# Patient Record
Sex: Female | Born: 2000 | Race: White | Hispanic: No | Marital: Single | Smoking: Never smoker
Health system: Southern US, Community
[De-identification: ages and names within clinical notes are randomized; demographics above are authoritative.]

## PROBLEM LIST (undated history)

## (undated) DIAGNOSIS — F419 Anxiety disorder, unspecified: Secondary | ICD-10-CM

## (undated) HISTORY — PX: WISDOM TOOTH EXTRACTION: SHX21

---
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
## 2019-04-30 ENCOUNTER — Ambulatory Visit (INDEPENDENT_AMBULATORY_CARE_PROVIDER_SITE_OTHER): Payer: BC Managed Care – PPO | Admitting: Psychology

## 2019-04-30 DIAGNOSIS — F419 Anxiety disorder, unspecified: Secondary | ICD-10-CM

## 2019-05-16 ENCOUNTER — Ambulatory Visit (INDEPENDENT_AMBULATORY_CARE_PROVIDER_SITE_OTHER): Payer: BC Managed Care – PPO | Admitting: Psychology

## 2019-05-16 DIAGNOSIS — F419 Anxiety disorder, unspecified: Secondary | ICD-10-CM | POA: Diagnosis not present

## 2019-05-19 ENCOUNTER — Ambulatory Visit (INDEPENDENT_AMBULATORY_CARE_PROVIDER_SITE_OTHER): Payer: BC Managed Care – PPO | Admitting: Psychology

## 2019-05-19 DIAGNOSIS — F419 Anxiety disorder, unspecified: Secondary | ICD-10-CM | POA: Diagnosis not present

## 2019-05-30 ENCOUNTER — Ambulatory Visit (INDEPENDENT_AMBULATORY_CARE_PROVIDER_SITE_OTHER): Payer: BC Managed Care – PPO | Admitting: Psychology

## 2019-05-30 DIAGNOSIS — F419 Anxiety disorder, unspecified: Secondary | ICD-10-CM | POA: Diagnosis not present

## 2019-06-04 ENCOUNTER — Ambulatory Visit (INDEPENDENT_AMBULATORY_CARE_PROVIDER_SITE_OTHER): Payer: BC Managed Care – PPO | Admitting: Psychology

## 2019-06-04 DIAGNOSIS — F419 Anxiety disorder, unspecified: Secondary | ICD-10-CM | POA: Diagnosis not present

## 2019-06-10 ENCOUNTER — Ambulatory Visit (INDEPENDENT_AMBULATORY_CARE_PROVIDER_SITE_OTHER): Payer: BC Managed Care – PPO | Admitting: Psychology

## 2019-06-10 DIAGNOSIS — F419 Anxiety disorder, unspecified: Secondary | ICD-10-CM | POA: Diagnosis not present

## 2019-06-23 ENCOUNTER — Ambulatory Visit: Payer: BC Managed Care – PPO | Admitting: Psychology

## 2019-06-27 ENCOUNTER — Ambulatory Visit (INDEPENDENT_AMBULATORY_CARE_PROVIDER_SITE_OTHER): Payer: BC Managed Care – PPO | Admitting: Psychology

## 2019-06-27 DIAGNOSIS — F419 Anxiety disorder, unspecified: Secondary | ICD-10-CM

## 2019-07-11 ENCOUNTER — Ambulatory Visit (INDEPENDENT_AMBULATORY_CARE_PROVIDER_SITE_OTHER): Payer: BC Managed Care – PPO | Admitting: Psychology

## 2019-07-11 DIAGNOSIS — F419 Anxiety disorder, unspecified: Secondary | ICD-10-CM | POA: Diagnosis not present

## 2019-07-25 ENCOUNTER — Ambulatory Visit (INDEPENDENT_AMBULATORY_CARE_PROVIDER_SITE_OTHER): Payer: BC Managed Care – PPO | Admitting: Psychology

## 2019-07-25 DIAGNOSIS — F419 Anxiety disorder, unspecified: Secondary | ICD-10-CM

## 2019-08-01 ENCOUNTER — Ambulatory Visit: Payer: BC Managed Care – PPO | Admitting: Psychology

## 2019-08-08 ENCOUNTER — Ambulatory Visit (INDEPENDENT_AMBULATORY_CARE_PROVIDER_SITE_OTHER): Payer: BC Managed Care – PPO | Admitting: Psychology

## 2019-08-08 DIAGNOSIS — F419 Anxiety disorder, unspecified: Secondary | ICD-10-CM | POA: Diagnosis not present

## 2019-08-22 ENCOUNTER — Ambulatory Visit (INDEPENDENT_AMBULATORY_CARE_PROVIDER_SITE_OTHER): Payer: BC Managed Care – PPO | Admitting: Psychology

## 2019-08-22 DIAGNOSIS — F419 Anxiety disorder, unspecified: Secondary | ICD-10-CM

## 2019-09-05 ENCOUNTER — Ambulatory Visit (INDEPENDENT_AMBULATORY_CARE_PROVIDER_SITE_OTHER): Payer: BC Managed Care – PPO | Admitting: Psychology

## 2019-09-05 DIAGNOSIS — F419 Anxiety disorder, unspecified: Secondary | ICD-10-CM | POA: Diagnosis not present

## 2019-09-19 ENCOUNTER — Ambulatory Visit (INDEPENDENT_AMBULATORY_CARE_PROVIDER_SITE_OTHER): Payer: BC Managed Care – PPO | Admitting: Psychology

## 2019-09-19 DIAGNOSIS — F419 Anxiety disorder, unspecified: Secondary | ICD-10-CM | POA: Diagnosis not present

## 2019-10-11 ENCOUNTER — Ambulatory Visit (INDEPENDENT_AMBULATORY_CARE_PROVIDER_SITE_OTHER): Payer: BC Managed Care – PPO | Admitting: Psychology

## 2019-10-11 DIAGNOSIS — F419 Anxiety disorder, unspecified: Secondary | ICD-10-CM | POA: Diagnosis not present

## 2019-10-13 ENCOUNTER — Other Ambulatory Visit: Payer: Self-pay

## 2019-10-17 ENCOUNTER — Ambulatory Visit (INDEPENDENT_AMBULATORY_CARE_PROVIDER_SITE_OTHER): Payer: BC Managed Care – PPO | Admitting: Psychology

## 2019-10-17 DIAGNOSIS — F419 Anxiety disorder, unspecified: Secondary | ICD-10-CM | POA: Diagnosis not present

## 2019-10-22 ENCOUNTER — Ambulatory Visit (INDEPENDENT_AMBULATORY_CARE_PROVIDER_SITE_OTHER): Payer: BC Managed Care – PPO | Admitting: Psychology

## 2019-10-22 DIAGNOSIS — F419 Anxiety disorder, unspecified: Secondary | ICD-10-CM

## 2019-11-03 ENCOUNTER — Ambulatory Visit (INDEPENDENT_AMBULATORY_CARE_PROVIDER_SITE_OTHER): Payer: BC Managed Care – PPO | Admitting: Psychology

## 2019-11-03 DIAGNOSIS — F419 Anxiety disorder, unspecified: Secondary | ICD-10-CM

## 2019-11-14 ENCOUNTER — Ambulatory Visit: Payer: BC Managed Care – PPO | Admitting: Psychology

## 2019-11-17 ENCOUNTER — Ambulatory Visit (INDEPENDENT_AMBULATORY_CARE_PROVIDER_SITE_OTHER): Payer: BC Managed Care – PPO | Admitting: Psychology

## 2019-11-17 DIAGNOSIS — F419 Anxiety disorder, unspecified: Secondary | ICD-10-CM

## 2019-11-28 ENCOUNTER — Ambulatory Visit: Payer: BC Managed Care – PPO | Admitting: Psychology

## 2019-12-01 ENCOUNTER — Ambulatory Visit: Payer: BC Managed Care – PPO | Admitting: Psychology

## 2019-12-06 ENCOUNTER — Ambulatory Visit (INDEPENDENT_AMBULATORY_CARE_PROVIDER_SITE_OTHER): Payer: BC Managed Care – PPO | Admitting: Psychology

## 2019-12-06 DIAGNOSIS — F419 Anxiety disorder, unspecified: Secondary | ICD-10-CM | POA: Diagnosis not present

## 2019-12-12 ENCOUNTER — Ambulatory Visit: Payer: BC Managed Care – PPO | Admitting: Psychology

## 2019-12-15 ENCOUNTER — Ambulatory Visit (INDEPENDENT_AMBULATORY_CARE_PROVIDER_SITE_OTHER): Payer: BC Managed Care – PPO | Admitting: Psychology

## 2019-12-15 DIAGNOSIS — F419 Anxiety disorder, unspecified: Secondary | ICD-10-CM

## 2019-12-26 ENCOUNTER — Ambulatory Visit: Payer: BC Managed Care – PPO | Admitting: Psychology

## 2019-12-29 ENCOUNTER — Ambulatory Visit: Payer: BC Managed Care – PPO | Admitting: Psychology

## 2020-01-02 ENCOUNTER — Ambulatory Visit (INDEPENDENT_AMBULATORY_CARE_PROVIDER_SITE_OTHER): Payer: BC Managed Care – PPO | Admitting: Psychology

## 2020-01-02 DIAGNOSIS — F419 Anxiety disorder, unspecified: Secondary | ICD-10-CM | POA: Diagnosis not present

## 2020-01-09 ENCOUNTER — Ambulatory Visit: Payer: BC Managed Care – PPO | Admitting: Psychology

## 2020-01-12 ENCOUNTER — Ambulatory Visit (INDEPENDENT_AMBULATORY_CARE_PROVIDER_SITE_OTHER): Payer: BC Managed Care – PPO | Admitting: Psychology

## 2020-01-12 DIAGNOSIS — F419 Anxiety disorder, unspecified: Secondary | ICD-10-CM

## 2020-01-23 ENCOUNTER — Ambulatory Visit: Payer: BC Managed Care – PPO | Admitting: Psychology

## 2020-01-26 ENCOUNTER — Ambulatory Visit: Payer: BC Managed Care – PPO | Admitting: Psychology

## 2020-02-06 ENCOUNTER — Ambulatory Visit: Payer: BC Managed Care – PPO | Admitting: Psychology

## 2020-02-09 ENCOUNTER — Ambulatory Visit (INDEPENDENT_AMBULATORY_CARE_PROVIDER_SITE_OTHER): Payer: BC Managed Care – PPO | Admitting: Psychology

## 2020-02-09 DIAGNOSIS — F419 Anxiety disorder, unspecified: Secondary | ICD-10-CM

## 2020-02-20 ENCOUNTER — Ambulatory Visit: Payer: BC Managed Care – PPO | Admitting: Psychology

## 2020-02-23 ENCOUNTER — Ambulatory Visit (INDEPENDENT_AMBULATORY_CARE_PROVIDER_SITE_OTHER): Payer: BC Managed Care – PPO | Admitting: Psychology

## 2020-02-23 DIAGNOSIS — F419 Anxiety disorder, unspecified: Secondary | ICD-10-CM | POA: Diagnosis not present

## 2020-03-05 ENCOUNTER — Ambulatory Visit: Payer: BC Managed Care – PPO | Admitting: Psychology

## 2020-03-08 ENCOUNTER — Ambulatory Visit: Payer: BC Managed Care – PPO | Admitting: Psychology

## 2020-03-19 ENCOUNTER — Ambulatory Visit: Payer: BC Managed Care – PPO | Admitting: Psychology

## 2020-03-22 ENCOUNTER — Ambulatory Visit: Payer: BC Managed Care – PPO | Admitting: Psychology

## 2020-03-29 ENCOUNTER — Ambulatory Visit: Payer: BC Managed Care – PPO | Admitting: Psychology

## 2020-04-02 ENCOUNTER — Ambulatory Visit: Payer: BC Managed Care – PPO | Admitting: Psychology

## 2020-04-16 ENCOUNTER — Ambulatory Visit: Payer: BC Managed Care – PPO | Admitting: Psychology

## 2020-04-30 ENCOUNTER — Ambulatory Visit: Payer: BC Managed Care – PPO | Admitting: Psychology

## 2020-07-09 ENCOUNTER — Ambulatory Visit (INDEPENDENT_AMBULATORY_CARE_PROVIDER_SITE_OTHER): Payer: BC Managed Care – PPO | Admitting: Psychology

## 2020-07-09 DIAGNOSIS — F419 Anxiety disorder, unspecified: Secondary | ICD-10-CM | POA: Diagnosis not present

## 2020-08-05 ENCOUNTER — Ambulatory Visit (INDEPENDENT_AMBULATORY_CARE_PROVIDER_SITE_OTHER): Payer: BC Managed Care – PPO | Admitting: Psychology

## 2020-08-05 DIAGNOSIS — F419 Anxiety disorder, unspecified: Secondary | ICD-10-CM | POA: Diagnosis not present

## 2020-08-25 ENCOUNTER — Ambulatory Visit (INDEPENDENT_AMBULATORY_CARE_PROVIDER_SITE_OTHER): Payer: BC Managed Care – PPO | Admitting: Psychology

## 2020-08-25 DIAGNOSIS — F419 Anxiety disorder, unspecified: Secondary | ICD-10-CM | POA: Diagnosis not present

## 2020-09-16 ENCOUNTER — Ambulatory Visit (INDEPENDENT_AMBULATORY_CARE_PROVIDER_SITE_OTHER): Payer: BC Managed Care – PPO | Admitting: Psychology

## 2020-09-16 DIAGNOSIS — F419 Anxiety disorder, unspecified: Secondary | ICD-10-CM

## 2020-10-06 ENCOUNTER — Ambulatory Visit (INDEPENDENT_AMBULATORY_CARE_PROVIDER_SITE_OTHER): Payer: BC Managed Care – PPO | Admitting: Psychology

## 2020-10-06 DIAGNOSIS — F419 Anxiety disorder, unspecified: Secondary | ICD-10-CM

## 2020-11-17 ENCOUNTER — Ambulatory Visit (INDEPENDENT_AMBULATORY_CARE_PROVIDER_SITE_OTHER): Payer: BC Managed Care – PPO | Admitting: Psychology

## 2020-11-17 DIAGNOSIS — F419 Anxiety disorder, unspecified: Secondary | ICD-10-CM | POA: Diagnosis not present

## 2020-12-08 ENCOUNTER — Ambulatory Visit (INDEPENDENT_AMBULATORY_CARE_PROVIDER_SITE_OTHER): Payer: BC Managed Care – PPO | Admitting: Psychology

## 2020-12-08 DIAGNOSIS — F419 Anxiety disorder, unspecified: Secondary | ICD-10-CM | POA: Diagnosis not present

## 2020-12-29 ENCOUNTER — Ambulatory Visit (INDEPENDENT_AMBULATORY_CARE_PROVIDER_SITE_OTHER): Payer: BC Managed Care – PPO | Admitting: Psychology

## 2020-12-29 DIAGNOSIS — F419 Anxiety disorder, unspecified: Secondary | ICD-10-CM | POA: Diagnosis not present

## 2021-01-19 ENCOUNTER — Ambulatory Visit (INDEPENDENT_AMBULATORY_CARE_PROVIDER_SITE_OTHER): Payer: BC Managed Care – PPO | Admitting: Psychology

## 2021-01-19 ENCOUNTER — Ambulatory Visit: Payer: BC Managed Care – PPO | Admitting: Psychology

## 2021-01-19 DIAGNOSIS — F419 Anxiety disorder, unspecified: Secondary | ICD-10-CM | POA: Diagnosis not present

## 2021-02-09 ENCOUNTER — Ambulatory Visit (INDEPENDENT_AMBULATORY_CARE_PROVIDER_SITE_OTHER): Payer: BC Managed Care – PPO | Admitting: Psychology

## 2021-02-09 DIAGNOSIS — F419 Anxiety disorder, unspecified: Secondary | ICD-10-CM | POA: Diagnosis not present

## 2021-03-10 ENCOUNTER — Ambulatory Visit (INDEPENDENT_AMBULATORY_CARE_PROVIDER_SITE_OTHER): Payer: BC Managed Care – PPO | Admitting: Psychology

## 2021-03-10 DIAGNOSIS — F419 Anxiety disorder, unspecified: Secondary | ICD-10-CM

## 2023-04-06 ENCOUNTER — Ambulatory Visit (INDEPENDENT_AMBULATORY_CARE_PROVIDER_SITE_OTHER): Payer: BC Managed Care – PPO | Admitting: Plastic Surgery

## 2023-04-06 ENCOUNTER — Encounter: Payer: Self-pay | Admitting: Plastic Surgery

## 2023-04-06 VITALS — BP 139/83 | HR 61 | Ht 67.0 in | Wt 176.0 lb

## 2023-04-06 DIAGNOSIS — N62 Hypertrophy of breast: Secondary | ICD-10-CM

## 2023-04-06 DIAGNOSIS — M542 Cervicalgia: Secondary | ICD-10-CM | POA: Diagnosis not present

## 2023-04-06 DIAGNOSIS — Z6827 Body mass index (BMI) 27.0-27.9, adult: Secondary | ICD-10-CM

## 2023-04-06 DIAGNOSIS — M546 Pain in thoracic spine: Secondary | ICD-10-CM

## 2023-04-06 DIAGNOSIS — M549 Dorsalgia, unspecified: Secondary | ICD-10-CM | POA: Insufficient documentation

## 2023-04-06 DIAGNOSIS — R21 Rash and other nonspecific skin eruption: Secondary | ICD-10-CM

## 2023-04-06 DIAGNOSIS — M545 Low back pain, unspecified: Secondary | ICD-10-CM | POA: Diagnosis not present

## 2023-04-06 DIAGNOSIS — G8929 Other chronic pain: Secondary | ICD-10-CM

## 2023-04-06 NOTE — Progress Notes (Signed)
Patient ID: Alice Vasquez, female    DOB: 2000/11/27, 22 y.o.   MRN: [id number]   Chief Complaint  Patient presents with   Advice Only   Breast Problem    Mammary Hyperplasia: The patient is a 22 y.o. female with a history of mammary hyperplasia for several years.  She has extremely large breasts causing symptoms that include the following: Back pain in the upper and lower back, including neck pain. She pulls or pins her bra straps to provide better lift and relief of the pressure and pain. She notices relief by holding her breast up manually.  Her shoulder straps cause grooves and pain and pressure that requires padding for relief. Pain medication is sometimes required with motrin and tylenol.  Activities that are hindered by enlarged breasts include: exercise and running.  She has tried supportive clothing as well as fitted bras without improvement.  Her breasts are extremely large and fairly symmetric.  She has hyperpigmentation of the inframammary area on both sides.  The sternal to nipple distance on the right is 28 cm and the left is 28 cm.  The IMF distance is 12 cm.  She is 5 feet 7 inches tall and weighs 173 pounds.  The BMI = 27.1 kg/m.  Preoperative bra size = D/DD cup. She would like to be as small as possible. The estimated excess breast tissue to be removed at the time of surgery = 400-500 grams on the left and 400-500 grams on the right.  Mammogram history: none.  Family history of breast cancer:  none.  Tobacco use:  none.   The patient expresses the desire to pursue surgical intervention.  The patient has a history of scoliosis and major headaches.  She is also having some right-sided abdominal pain and is going to have an endoscopy for evaluation and evaluation of her gallbladder.  She has scoliosis which is likely contributing to some of the pain and discomfort.  She has not had physical therapy yet.    Review of Systems  Constitutional:  Positive for activity change.  Negative for appetite change.  Eyes: Negative.   Respiratory: Negative.  Negative for chest tightness and shortness of breath.   Cardiovascular: Negative.   Gastrointestinal: Negative.   Endocrine: Negative.   Genitourinary: Negative.   Musculoskeletal:  Positive for back pain and neck pain.  Skin:  Positive for rash.    History reviewed. No pertinent past medical history.  History reviewed. No pertinent surgical history.    Current Outpatient Medications:    ibuprofen (ADVIL,MOTRIN) 400 MG tablet, Take 1 tablet (400 mg total) by mouth every 6 (six) hours as needed for pain. Take 1 tablet three times daily with meal, Disp: 30 tablet, Rfl: 0   Objective:   There were no vitals filed for this visit.  Physical Exam Vitals and nursing note reviewed.  Constitutional:      Appearance: Normal appearance.  HENT:     Head: Normocephalic and atraumatic.  Cardiovascular:     Rate and Rhythm: Normal rate.     Pulses: Normal pulses.  Pulmonary:     Effort: Pulmonary effort is normal.  Abdominal:     Palpations: Abdomen is soft.     Tenderness: There is no abdominal tenderness.  Musculoskeletal:        General: No swelling or deformity.  Skin:    General: Skin is warm.     Capillary Refill: Capillary refill takes less than 2 seconds.  Coloration: Skin is not jaundiced.     Findings: No bruising.  Neurological:     Mental Status: She is alert and oriented to person, place, and time.  Psychiatric:        Mood and Affect: Mood normal.        Behavior: Behavior normal.        Thought Content: Thought content normal.        Judgment: Judgment normal.    Assessment & Plan:  Chronic bilateral thoracic back pain  Neck pain  Symptomatic mammary hypertrophy  The procedure the patient selected and that was best for the patient was discussed. The risk were discussed and include but not limited to the following:  Breast asymmetry, fluid accumulation, firmness of the breast,  inability to breast feed, loss of nipple or areola, skin loss, change in skin and nipple sensation, fat necrosis of the breast tissue, bleeding, infection and healing delay.  There are risks of anesthesia and injury to nerves or blood vessels.  Allergic reaction to tape, suture and skin glue are possible.  There will be swelling.  Any of these can lead to the need for revisional surgery which is not included in this surgery.  A breast reduction has potential to interfere with diagnostic procedures in the future.  This procedure is best done when the breast is fully developed.  Changes in the breast will continue to occur over time: pregnancy, weight gain or weigh loss. No guarantees are given for a certain bra or breast size.    Total time: 40 minutes. This includes time spent with the patient during the visit as well as time spent before and after the visit reviewing the chart, documenting the encounter, ordering pertinent studies and literature for the patient.   Physical therapy: Ordered Mammogram: Not indicated  Patient has [employer] and will plan for physical therapy for 6 weeks and then see [place] back.  In the meantime she will try to lose 5 to 10 pounds and we will reevaluate her numbers.  I understand that she will likely need around 550 to 600 g removed from each breast at her current numbers.  Pictures were obtained of the patient and placed in the chart with the patient's or guardian's permission.   Alena Bills Kairie Vangieson, DO

## 2023-04-06 NOTE — Addendum Note (Signed)
Addended by: Verdie Shire on: 04/06/2023 03:57 PM   Modules accepted: Orders

## 2023-04-20 ENCOUNTER — Telehealth: Payer: Self-pay

## 2023-04-20 NOTE — Telephone Encounter (Signed)
Patient called wanting to know if her Physical Therapy referral can be for [facility] instead of [facility].  Advised patient it may not be in-network and we may have to do another Pre-authorization to be seen in [facility]  Patient verbalized she lives in [place], [place] and does not want to drive here for Physical Therapy.  Please follow up with patient @ [phone].

## 2023-05-09 ENCOUNTER — Ambulatory Visit: Payer: BC Managed Care – PPO

## 2023-05-28 ENCOUNTER — Ambulatory Visit: Payer: Self-pay | Admitting: Student

## 2023-06-06 ENCOUNTER — Telehealth (INDEPENDENT_AMBULATORY_CARE_PROVIDER_SITE_OTHER): Payer: BC Managed Care – PPO | Admitting: Plastic Surgery

## 2023-06-06 ENCOUNTER — Ambulatory Visit (INDEPENDENT_AMBULATORY_CARE_PROVIDER_SITE_OTHER): Payer: BC Managed Care – PPO | Admitting: Student

## 2023-06-06 VITALS — Wt 172.0 lb

## 2023-06-06 DIAGNOSIS — M542 Cervicalgia: Secondary | ICD-10-CM | POA: Diagnosis not present

## 2023-06-06 DIAGNOSIS — R21 Rash and other nonspecific skin eruption: Secondary | ICD-10-CM | POA: Diagnosis not present

## 2023-06-06 DIAGNOSIS — M25511 Pain in right shoulder: Secondary | ICD-10-CM

## 2023-06-06 DIAGNOSIS — N62 Hypertrophy of breast: Secondary | ICD-10-CM

## 2023-06-06 DIAGNOSIS — M25512 Pain in left shoulder: Secondary | ICD-10-CM

## 2023-06-06 NOTE — Telephone Encounter (Signed)
Done with PT

## 2023-06-06 NOTE — Progress Notes (Signed)
   Referring Provider No referring provider defined for this encounter.   CC:  Chief Complaint  Patient presents with   Follow-up      Alice Vasquez is an 22 y.o. female.  HPI: Patient is a 22 y.o. year old female here for follow up after completing physical therapy for pain related to macromastia.   She was seen for initial consult by Dr. Ulice Bold on 04/06/2023.  At that time, patient complained of back and neck pain.  She also reported that activities were hindered by her enlarged breasts including exercise and running.  On exam, her STN on the right was 28 cm and her STN on the left was at 28 cm.  Her BMI was 27.1 kg/m.  Her preoperative bra size is a D/DDD cup.  Patient expressed that she would like to be as small as possible.  The estimated amount of tissue to be removed at the time of surgery was 400 500 g bilaterally.  Patient did reports she had history of scoliosis and major headaches.  She also reported at the time she was having some right-sided abdominal pain and was going to have an endoscopy for evaluation.  Plan was for patient to do 6 weeks of physical therapy and also try to lose 5 to 10 pounds.  Today, patient reports she is doing well.  She reports that since her previous visit, she underwent the endoscopy which showed inflammation along her stomach.  She also states that she was found to have an overactive gallbladder.  She states though that she was placed on medications for management, and will not be undergoing any procedures at this time.    Patient also states that she has completed 6 weeks of physical therapy.  She states that although she has been undergoing physical therapy, she is still experiencing neck and shoulder pain.  She states that her shoulder pain is continued and bothersome.  Patient also reports she gets intermittent rashes underneath her breast.  She expresses the desire to undergo breast reduction.   Review of Systems General: Denies any changes in  her health.  Denies any fevers or chills MSK: Endorses ongoing back and neck discomfort Skin: Reports intermittent rashes  Physical Exam    06/06/2023    1:26 PM 04/06/2023   11:00 AM 03/03/2013    2:56 PM  Vitals with BMI  Height  5\' 7"    Weight 172 lbs 176 lbs 110 lbs  BMI  27.56   Systolic  139 116  Diastolic  83 73  Pulse  61 76    General:  No acute distress,  Alert and oriented, Non-Toxic, Normal speech and affect Psych: Normal behavior and mood Respiratory: No increased WOB MSK: Ambulatory  Assessment/Plan  Patient is interested in pursuing surgical intervention for bilateral breast reduction. Patient has completed at least 6 weeks of physical therapy for pain related to macromastia.  Discussed with patient we would submit to insurance for authorization, discussed approval could take up to 6 weeks.   Laurena Spies 06/06/2023, 1:47 PM

## 2023-06-11 NOTE — Addendum Note (Signed)
Addended by: Caroline More on: 06/11/2023 12:23 PM   Modules accepted: Orders

## 2023-12-05 ENCOUNTER — Telehealth: Payer: Self-pay | Admitting: Plastic Surgery

## 2023-12-14 ENCOUNTER — Ambulatory Visit (INDEPENDENT_AMBULATORY_CARE_PROVIDER_SITE_OTHER): Payer: BC Managed Care – PPO | Admitting: Surgical

## 2023-12-14 ENCOUNTER — Encounter: Payer: Self-pay | Admitting: Surgical

## 2023-12-14 VITALS — BP 143/89 | HR 74 | Ht 67.0 in | Wt 170.0 lb

## 2023-12-14 DIAGNOSIS — N62 Hypertrophy of breast: Secondary | ICD-10-CM

## 2023-12-14 DIAGNOSIS — G8929 Other chronic pain: Secondary | ICD-10-CM

## 2023-12-14 DIAGNOSIS — M542 Cervicalgia: Secondary | ICD-10-CM

## 2023-12-14 DIAGNOSIS — M546 Pain in thoracic spine: Secondary | ICD-10-CM

## 2023-12-14 MED ORDER — ONDANSETRON HCL 4 MG PO TABS: 4.0000 mg | ORAL_TABLET | Freq: Three times a day (TID) | ORAL | 0 refills | Status: AC | PRN

## 2023-12-14 MED ORDER — CEPHALEXIN 500 MG PO CAPS: 500.0000 mg | ORAL_CAPSULE | Freq: Four times a day (QID) | ORAL | 0 refills | Status: AC

## 2023-12-14 MED ORDER — OXYCODONE HCL 5 MG PO TABS: 5.0000 mg | ORAL_TABLET | Freq: Four times a day (QID) | ORAL | 0 refills | Status: AC | PRN

## 2023-12-14 NOTE — Progress Notes (Signed)
 Patient ID: Alice Vasquez, female    DOB: 21-Nov-2000, 23 y.o.   MRN: [id number]  Chief Complaint  Patient presents with   Pre-op Exam      ICD-10-CM   1. Symptomatic mammary hypertrophy  N62     2. Chronic bilateral thoracic back pain  M54.6    G89.29     3. Neck pain  M54.2       History of Present Illness: Alice Vasquez is a 23 y.o.  female  with a history of macromastia.  She presents for preoperative evaluation for upcoming procedure, Bilateral Breast Reduction with possible liposuction, scheduled for 12/24/2023 with Dr.  Ulice Bold  The patient has not had problems with anesthesia. No history of DVT/PE.  No family history of DVT/PE.  No family or personal history of bleeding or clotting disorders.  Patient is not currently taking any blood thinners.  No history of CVA/MI.   Summary of Previous Visit: STN is 28 cm bilaterally, IMF is 12 cm.  Preoperative bra size equals D/double D cup.  She would like to be as small as possible.  No family history of breast cancer.  No tobacco use.  She has a history of scoliosis.  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  She has had an endoscopy and colonoscopy in the past.  No history of Crohn's or ulcerative colitis.  She has tolerated colonoscopy and endoscopy in the past without any issues, she has also had dental surgery without issues.  No known family history of anesthetic complications.  She has not had any recent changes to her health.  She reports that she would like to be about a B/small C cup.  She does report a history of allergies, has been seen by an allergist.  She was prescribed albuterol, but reports that she never uses this and does not have any history of asthma.  Past Medical History: Allergies: No Known Allergies  Current Medications:  Current Outpatient Medications:    albuterol (VENTOLIN HFA) 108 (90  Base) MCG/ACT inhaler, SMARTSIG:2 Puff(s) By Mouth Every 4 Hours PRN, Disp: , Rfl:    ALLERGY RELIEF 180 MG tablet, Take 180 mg by mouth as needed for allergies., Disp: , Rfl:    AUVI-Q 0.3 MG/0.3ML SOAJ injection, Inject into the muscle as needed (allergies)., Disp: , Rfl:    busPIRone (BUSPAR) 5 MG tablet, Take 5 mg by mouth 2 (two) times daily., Disp: , Rfl:    dicyclomine (BENTYL) 10 MG capsule, Take 10 mg by mouth 3 (three) times daily as needed., Disp: , Rfl:    fluticasone (FLONASE) 50 MCG/ACT nasal spray, as needed., Disp: , Rfl:    hydrOXYzine (ATARAX) 10 MG tablet, Take 10 mg by mouth at bedtime., Disp: , Rfl:    ibuprofen (ADVIL,MOTRIN) 400 MG tablet, Take 1 tablet (400 mg total) by mouth every 6 (six) hours as needed for pain. Take 1 tablet three times daily with meal, Disp: 30 tablet, Rfl: 0   terbinafine (LAMISIL) 1 % cream, Apply to the affected area twice a day for 1-2 weeks., Disp: , Rfl:   Past Medical Problems: History reviewed. No pertinent past medical history.  Past Surgical History: History reviewed. No pertinent surgical history.  Social History: Social History   Socioeconomic History   Marital status: Single    Spouse name: Not on file   Number  of children: Not on file   Years of education: Not on file   Highest education level: Not on file  Occupational History   Not on file  Tobacco Use   Smoking status: Never   Smokeless tobacco: Not on file  Substance and Sexual Activity   Alcohol use: No   Drug use: No   Sexual activity: Not on file  Other Topics Concern   Not on file  Social History Narrative   Not on file   Social Drivers of Health   Financial Resource Strain: Not on file  Food Insecurity: Not on file  Transportation Needs: Not on file  Physical Activity: Not on file  Stress: Not on file  Social Connections: Not on file  Intimate Partner Violence: Not on file    Family History: History reviewed. No pertinent family history.  Review  of Systems: Review of Systems  Constitutional: Negative.   Respiratory: Negative.    Cardiovascular: Negative.   Genitourinary: Negative.     Physical Exam: Vital Signs BP (!) 143/89 (BP Location: Left Arm, Patient Position: Sitting, Cuff Size: Normal)   Pulse 74   Ht 5\' 7"  (1.702 m)   Wt 170 lb (77.1 kg)   SpO2 97%   BMI 26.63 kg/m   Physical Exam Constitutional:      General: Not in acute distress.    Appearance: Normal appearance. Not ill-appearing.  HENT:     Head: Normocephalic and atraumatic.  Eyes:     Pupils: Pupils are equal, round Neck:     Musculoskeletal: Normal range of motion.  Cardiovascular:     Rate and Rhythm: Normal rate    Pulses: Normal pulses.  Pulmonary:     Effort: Pulmonary effort is normal. No respiratory distress.  Musculoskeletal: Normal range of motion.  Skin:    General: Skin is warm and dry.     Findings: No erythema or rash.  Neurological:     General: No focal deficit present.     Mental Status: Alert and oriented to person, place, and time. Mental status is at baseline.     Motor: No weakness.  Psychiatric:        Mood and Affect: Mood normal.        Behavior: Behavior normal.    Assessment/Plan: The patient is scheduled for bilateral breast reduction with possible liposuction with Dr. Ulice Bold.  Risks, benefits, and alternatives of procedure discussed, questions answered and consent obtained.    Smoking Status: Non-smoker; Counseling Given?  N/A Last Mammogram: No mammographic history due to age; Results: N/A  Caprini Score: 3; Risk Factors include: BMI > 25, and length of planned surgery. Recommendation for mechanical  prophylaxis. Encourage early ambulation.   Pictures obtained: @consult   Post-op Rx sent to pharmacy: Oxycodone, Zofran, Keflex  Patient was provided with the breast reduction and General Surgical Risk consent document and Pain Medication Agreement prior to their appointment.  They had adequate time to read  through the risk consent documents and Pain Medication Agreement. We also discussed them in person together during this preop appointment. All of their questions were answered to their satisfaction.  Recommended calling if they have any further questions.  Risk consent form and Pain Medication Agreement to be scanned into patient's chart.  The risk that can be encountered with breast reduction were discussed and include the following but not limited to these:  Breast asymmetry, fluid accumulation, firmness of the breast, inability to breast feed, loss of nipple or areola, skin loss,  decrease or no nipple sensation, fat necrosis of the breast tissue, bleeding, infection, healing delay.  There are risks of anesthesia, changes to skin sensation and injury to nerves or blood vessels.  The muscle can be temporarily or permanently injured.  You may have an allergic reaction to tape, suture, glue, blood products which can result in skin discoloration, swelling, pain, skin lesions, poor healing.  Any of these can lead to the need for revisonal surgery or stage procedures.  A reduction has potential to interfere with diagnostic procedures.  Nipple or breast piercing can increase risks of infection.  This procedure is best done when the breast is fully developed.  Changes in the breast will continue to occur over time.  Pregnancy can alter the outcomes of previous breast reduction surgery, weight gain and weigh loss can also effect the long term appearance.   Confirmed with surgical schedulers today that 575 g was required to be removed from bilateral breasts.  Electronically signed by: Kermit Balo Lamount Bankson, PA-C 12/14/2023 10:08 AM

## 2023-12-14 NOTE — H&P (View-Only) (Signed)
 Patient ID: Alice Vasquez, female    DOB: 08-24-2001, 23 y.o.   MRN: [id number]  Chief Complaint  Patient presents with   Pre-op Exam      ICD-10-CM   1. Symptomatic mammary hypertrophy  N62     2. Chronic bilateral thoracic back pain  M54.6    G89.29     3. Neck pain  M54.2       History of Present Illness: Alice Vasquez is a 23 y.o.  female  with a history of macromastia.  She presents for preoperative evaluation for upcoming procedure, Bilateral Breast Reduction with possible liposuction, scheduled for 12/24/2023 with Dr.  Lowery  The patient has not had problems with anesthesia. No history of DVT/PE.  No family history of DVT/PE.  No family or personal history of bleeding or clotting disorders.  Patient is not currently taking any blood thinners.  No history of CVA/MI.   Summary of Previous Visit: STN is 28 cm bilaterally, IMF is 12 cm.  Preoperative bra size equals D/double D cup.  She would like to be as small as possible.  No family history of breast cancer.  No tobacco use.  She has a history of scoliosis.  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  She has had an endoscopy and colonoscopy in the past.  No history of Crohn's or ulcerative colitis.  She has tolerated colonoscopy and endoscopy in the past without any issues, she has also had dental surgery without issues.  No known family history of anesthetic complications.  She has not had any recent changes to her health.  She reports that she would like to be about a B/small C cup.  She does report a history of allergies, has been seen by an allergist.  She was prescribed albuterol, but reports that she never uses this and does not have any history of asthma.  Past Medical History: Allergies: No Known Allergies  Current Medications:  Current Outpatient Medications:    albuterol (VENTOLIN HFA) 108 (90  Base) MCG/ACT inhaler, SMARTSIG:2 Puff(s) By Mouth Every 4 Hours PRN, Disp: , Rfl:    ALLERGY RELIEF 180 MG tablet, Take 180 mg by mouth as needed for allergies., Disp: , Rfl:    AUVI-Q  0.3 MG/0.3ML SOAJ injection, Inject into the muscle as needed (allergies)., Disp: , Rfl:    busPIRone (BUSPAR) 5 MG tablet, Take 5 mg by mouth 2 (two) times daily., Disp: , Rfl:    dicyclomine (BENTYL) 10 MG capsule, Take 10 mg by mouth 3 (three) times daily as needed., Disp: , Rfl:    fluticasone (FLONASE) 50 MCG/ACT nasal spray, as needed., Disp: , Rfl:    hydrOXYzine (ATARAX) 10 MG tablet, Take 10 mg by mouth at bedtime., Disp: , Rfl:    ibuprofen  (ADVIL ,MOTRIN ) 400 MG tablet, Take 1 tablet (400 mg total) by mouth every 6 (six) hours as needed for pain. Take 1 tablet three times daily with meal, Disp: 30 tablet, Rfl: 0   terbinafine (LAMISIL) 1 % cream, Apply to the affected area twice a day for 1-2 weeks., Disp: , Rfl:   Past Medical Problems: History reviewed. No pertinent past medical history.  Past Surgical History: History reviewed. No pertinent surgical history.  Social History: Social History   Socioeconomic History   Marital status: Single    Spouse name: Not on file   Number  of children: Not on file   Years of education: Not on file   Highest education level: Not on file  Occupational History   Not on file  Tobacco Use   Smoking status: Never   Smokeless tobacco: Not on file  Substance and Sexual Activity   Alcohol use: No   Drug use: No   Sexual activity: Not on file  Other Topics Concern   Not on file  Social History Narrative   Not on file   Social Drivers of Health   Financial Resource Strain: Not on file  Food Insecurity: Not on file  Transportation Needs: Not on file  Physical Activity: Not on file  Stress: Not on file  Social Connections: Not on file  Intimate Partner Violence: Not on file    Family History: History reviewed. No pertinent family history.  Review  of Systems: Review of Systems  Constitutional: Negative.   Respiratory: Negative.    Cardiovascular: Negative.   Genitourinary: Negative.     Physical Exam: Vital Signs BP (!) 143/89 (BP Location: Left Arm, Patient Position: Sitting, Cuff Size: Normal)   Pulse 74   Ht 5' 7 (1.702 m)   Wt 170 lb (77.1 kg)   SpO2 97%   BMI 26.63 kg/m   Physical Exam Constitutional:      General: Not in acute distress.    Appearance: Normal appearance. Not ill-appearing.  HENT:     Head: Normocephalic and atraumatic.  Eyes:     Pupils: Pupils are equal, round Neck:     Musculoskeletal: Normal range of motion.  Cardiovascular:     Rate and Rhythm: Normal rate    Pulses: Normal pulses.  Pulmonary:     Effort: Pulmonary effort is normal. No respiratory distress.  Musculoskeletal: Normal range of motion.  Skin:    General: Skin is warm and dry.     Findings: No erythema or rash.  Neurological:     General: No focal deficit present.     Mental Status: Alert and oriented to person, place, and time. Mental status is at baseline.     Motor: No weakness.  Psychiatric:        Mood and Affect: Mood normal.        Behavior: Behavior normal.    Assessment/Plan: The patient is scheduled for bilateral breast reduction with possible liposuction with Dr. Lowery.  Risks, benefits, and alternatives of procedure discussed, questions answered and consent obtained.    Smoking Status: Non-smoker; Counseling Given?  N/A Last Mammogram: No mammographic history due to age; Results: N/A  Caprini Score: 3; Risk Factors include: BMI > 25, and length of planned surgery. Recommendation for mechanical  prophylaxis. Encourage early ambulation.   Pictures obtained: @consult   Post-op Rx sent to pharmacy: Oxycodone , Zofran , Keflex   Patient was provided with the breast reduction and General Surgical Risk consent document and Pain Medication Agreement prior to their appointment.  They had adequate time to read  through the risk consent documents and Pain Medication Agreement. We also discussed them in person together during this preop appointment. All of their questions were answered to their satisfaction.  Recommended calling if they have any further questions.  Risk consent form and Pain Medication Agreement to be scanned into patient's chart.  The risk that can be encountered with breast reduction were discussed and include the following but not limited to these:  Breast asymmetry, fluid accumulation, firmness of the breast, inability to breast feed, loss of nipple or areola, skin loss,  decrease or no nipple sensation, fat necrosis of the breast tissue, bleeding, infection, healing delay.  There are risks of anesthesia, changes to skin sensation and injury to nerves or blood vessels.  The muscle can be temporarily or permanently injured.  You may have an allergic reaction to tape, suture, glue, blood products which can result in skin discoloration, swelling, pain, skin lesions, poor healing.  Any of these can lead to the need for revisonal surgery or stage procedures.  A reduction has potential to interfere with diagnostic procedures.  Nipple or breast piercing can increase risks of infection.  This procedure is best done when the breast is fully developed.  Changes in the breast will continue to occur over time.  Pregnancy can alter the outcomes of previous breast reduction surgery, weight gain and weigh loss can also effect the long term appearance.   Confirmed with surgical schedulers today that 575 g was required to be removed from bilateral breasts.  Electronically signed by: Donnice PARAS Tobiah Celestine, PA-C 12/14/2023 10:08 AM

## 2023-12-18 NOTE — Pre-Procedure Instructions (Signed)
Surgical Instructions   Your procedure is scheduled on December 24, 2023. Report to [facility] Main Entrance "A" at 7:30 A.M., then check in with the Admitting office. Any questions or running late day of surgery: call [phone]  Questions prior to your surgery date: call [phone], Monday-Friday, 8am-4pm. If you experience any cold or flu symptoms such as cough, fever, chills, shortness of breath, etc. between now and your scheduled surgery, please notify us at the above number.     Remember:  Do not eat after midnight the night before your surgery   You may drink clear liquids until 6:30 AM the morning of your surgery.   Clear liquids allowed are: Water, Non-Citrus Juices (without pulp), Carbonated Beverages, Clear Tea (no milk, honey, etc.), Black Coffee Only (NO MILK, CREAM OR POWDERED CREAMER of any kind), and Gatorade.    Take these medicines the morning of surgery with A SIP OF WATER: NONE   May take these medicines IF NEEDED: hydrOXYzine (ATARAX)  ondansetron (ZOFRAN)  oxyCODONE (OXY IR/ROXICODONE)  hyoscyamine (LEVSIN SL)    One week prior to surgery, STOP taking any Aspirin (unless otherwise instructed by your surgeon) Aleve, Naproxen, Ibuprofen, Motrin, Advil, Goody's, BC's, all herbal medications, fish oil, and non-prescription vitamins.                     Do NOT Smoke (Tobacco/Vaping) for 24 hours prior to your procedure.  If you use a CPAP at night, you may bring your mask/headgear for your overnight stay.   You will be asked to remove any contacts, glasses, piercing's, hearing aid's, dentures/partials prior to surgery. Please bring cases for these items if needed.    Patients discharged the day of surgery will not be allowed to drive home, and someone needs to stay with them for 24 hours.  SURGICAL WAITING ROOM VISITATION Patients may have no more than 2 support people in the waiting area - these visitors may rotate.   Pre-op nurse will coordinate an  appropriate time for 1 ADULT support person, who may not rotate, to accompany patient in pre-op.  Children under the age of 34 must have an adult with them who is not the patient and must remain in the main waiting area with an adult.  If the patient needs to stay at the hospital during part of their recovery, the visitor guidelines for inpatient rooms apply.  Please refer to the [facility] website for the visitor guidelines for any additional information.   If you received a COVID test during your pre-op visit  it is requested that you wear a mask when out in public, stay away from anyone that may not be feeling well and notify your surgeon if you develop symptoms. If you have been in contact with anyone that has tested positive in the last 10 days please notify you surgeon.      Pre-operative CHG Bathing Instructions   You can play a key role in reducing the risk of infection after surgery. Your skin needs to be as free of germs as possible. You can reduce the number of germs on your skin by washing with CHG (chlorhexidine gluconate) soap before surgery. CHG is an antiseptic soap that kills germs and continues to kill germs even after washing.   DO NOT use if you have an allergy to chlorhexidine/CHG or antibacterial soaps. If your skin becomes reddened or irritated, stop using the CHG and notify one of our RNs at [phone].  TAKE A SHOWER THE NIGHT BEFORE SURGERY AND THE DAY OF SURGERY    Please keep in mind the following:  DO NOT shave, including legs and underarms, 48 hours prior to surgery.   You may shave your face before/day of surgery.  Place clean sheets on your bed the night before surgery Use a clean washcloth (not used since being washed) for each shower. DO NOT sleep with pet's night before surgery.  CHG Shower Instructions:  Wash your face and private area with normal soap. If you choose to wash your hair, wash first with your normal shampoo.  After you use  shampoo/soap, rinse your hair and body thoroughly to remove shampoo/soap residue.  Turn the water OFF and apply half the bottle of CHG soap to a CLEAN washcloth.  Apply CHG soap ONLY FROM YOUR NECK DOWN TO YOUR TOES (washing for 3-5 minutes)  DO NOT use CHG soap on face, private areas, open wounds, or sores.  Pay special attention to the area where your surgery is being performed.  If you are having back surgery, having someone wash your back for you may be helpful. Wait 2 minutes after CHG soap is applied, then you may rinse off the CHG soap.  Pat dry with a clean towel  Put on clean pajamas    Additional instructions for the day of surgery: DO NOT APPLY any lotions, deodorants, cologne, or perfumes.   Do not wear jewelry or makeup Do not wear nail polish, gel polish, artificial nails, or any other type of covering on natural nails (fingers and toes) Do not bring valuables to the hospital. [facility] is not responsible for valuables/personal belongings. Put on clean/comfortable clothes.  Please brush your teeth.  Ask your nurse before applying any prescription medications to the skin.

## 2023-12-19 ENCOUNTER — Other Ambulatory Visit (HOSPITAL_COMMUNITY): Payer: BC Managed Care – PPO

## 2023-12-21 ENCOUNTER — Other Ambulatory Visit: Payer: Self-pay

## 2023-12-21 ENCOUNTER — Encounter (HOSPITAL_COMMUNITY): Payer: Self-pay

## 2023-12-21 ENCOUNTER — Encounter (HOSPITAL_COMMUNITY)
Admission: RE | Admit: 2023-12-21 | Discharge: 2023-12-21 | Disposition: A | Discharge status: Home / Self Care | Payer: BC Managed Care – PPO | Source: Ambulatory Visit | Attending: Plastic Surgery | Admitting: Plastic Surgery

## 2023-12-21 VITALS — BP 140/95 | HR 96 | Temp 98.4°F | Resp 16 | Ht 67.0 in | Wt 175.0 lb

## 2023-12-21 DIAGNOSIS — Z01812 Encounter for preprocedural laboratory examination: Secondary | ICD-10-CM | POA: Insufficient documentation

## 2023-12-21 DIAGNOSIS — M546 Pain in thoracic spine: Secondary | ICD-10-CM | POA: Diagnosis not present

## 2023-12-21 DIAGNOSIS — M542 Cervicalgia: Secondary | ICD-10-CM | POA: Diagnosis not present

## 2023-12-21 DIAGNOSIS — Z01818 Encounter for other preprocedural examination: Secondary | ICD-10-CM

## 2023-12-21 DIAGNOSIS — N62 Hypertrophy of breast: Secondary | ICD-10-CM | POA: Diagnosis present

## 2023-12-21 DIAGNOSIS — M419 Scoliosis, unspecified: Secondary | ICD-10-CM | POA: Diagnosis not present

## 2023-12-21 HISTORY — DX: Anxiety disorder, unspecified: F41.9

## 2023-12-21 LAB — CBC
HCT: 40.7 % (ref 36.0–46.0)
Hemoglobin: 13.8 g/dL (ref 12.0–15.0)
MCH: 28.9 pg (ref 26.0–34.0)
MCHC: 33.9 g/dL (ref 30.0–36.0)
MCV: 85.1 fL (ref 80.0–100.0)
Platelets: 233 10*3/uL (ref 150–400)
RBC: 4.78 MIL/uL (ref 3.87–5.11)
RDW: 11.8 % (ref 11.5–15.5)
WBC: 6.8 10*3/uL (ref 4.0–10.5)
nRBC: 0 % (ref 0.0–0.2)

## 2023-12-21 NOTE — Progress Notes (Signed)
PCP - Tiffany Bryan,DO Cardiologist - denies  PPM/ICD - denies Device Orders -  Rep Notified -   Chest x-ray - na EKG - na Stress Test -denies  ECHO - denies Cardiac Cath - denies  Sleep Study - denies CPAP -   Fasting Blood Sugar - na Checks Blood Sugar _____ times a day  Last dose of GLP1 agonist-  na GLP1 instructions:   Blood Thinner Instructions:na Aspirin Instructions:na  ERAS Protcol -no PRE-SURGERY Ensure or G2-   COVID TEST- na   Anesthesia review: no  Patient denies shortness of breath, fever, cough and chest pain at PAT appointment   All instructions explained to the patient, with a verbal understanding of the material. Patient agrees to go over the instructions while at home for a better understanding. Patient also instructed to wear a mask when out in public prior to surgery The opportunity to ask questions was provided.

## 2023-12-21 NOTE — Pre-Procedure Instructions (Signed)
Surgical Instructions   Your procedure is scheduled on December 24, 2023. Report to [facility] Main Entrance "A" at 7:30 A.M., then check in with the Admitting office. Any questions or running late day of surgery: call [phone]  Questions prior to your surgery date: call [phone], Monday-Friday, 8am-4pm. If you experience any cold or flu symptoms such as cough, fever, chills, shortness of breath, etc. between now and your scheduled surgery, please notify us at the above number.     Remember:  Do not eat or drink anything after midnight the night before your surgery   Take these medicines the morning of surgery with A SIP OF WATER: NONE   May take these medicines IF NEEDED: hydrOXYzine (ATARAX)  ondansetron (ZOFRAN)  oxyCODONE (OXY IR/ROXICODONE)  hyoscyamine (LEVSIN SL)    One week prior to surgery, STOP taking any Aspirin (unless otherwise instructed by your surgeon) Aleve, Naproxen, Ibuprofen, Motrin, Advil, Goody's, BC's, all herbal medications, fish oil, and non-prescription vitamins.                     Do NOT Smoke (Tobacco/Vaping) for 24 hours prior to your procedure.  If you use a CPAP at night, you may bring your mask/headgear for your overnight stay.   You will be asked to remove any contacts, glasses, piercing's, hearing aid's, dentures/partials prior to surgery. Please bring cases for these items if needed.    Patients discharged the day of surgery will not be allowed to drive home, and someone needs to stay with them for 24 hours.  SURGICAL WAITING ROOM VISITATION Patients may have no more than 2 support people in the waiting area - these visitors may rotate.   Pre-op nurse will coordinate an appropriate time for 1 ADULT support person, who may not rotate, to accompany patient in pre-op.  Children under the age of 64 must have an adult with them who is not the patient and must remain in the main waiting area with an adult.  If the patient needs to stay at  the hospital during part of their recovery, the visitor guidelines for inpatient rooms apply.  Please refer to the [facility] website for the visitor guidelines for any additional information.   If you received a COVID test during your pre-op visit  it is requested that you wear a mask when out in public, stay away from anyone that may not be feeling well and notify your surgeon if you develop symptoms. If you have been in contact with anyone that has tested positive in the last 10 days please notify you surgeon.      Pre-operative CHG Bathing Instructions   You can play a key role in reducing the risk of infection after surgery. Your skin needs to be as free of germs as possible. You can reduce the number of germs on your skin by washing with CHG (chlorhexidine gluconate) soap before surgery. CHG is an antiseptic soap that kills germs and continues to kill germs even after washing.   DO NOT use if you have an allergy to chlorhexidine/CHG or antibacterial soaps. If your skin becomes reddened or irritated, stop using the CHG and notify one of our RNs at [phone].              TAKE A SHOWER THE NIGHT BEFORE SURGERY AND THE DAY OF SURGERY    Please keep in mind the following:  DO NOT shave, including legs and underarms, 48 hours prior to surgery.  You may shave your face before/day of surgery.  Place clean sheets on your bed the night before surgery Use a clean washcloth (not used since being washed) for each shower. DO NOT sleep with pet's night before surgery.  CHG Shower Instructions:  Wash your face and private area with normal soap. If you choose to wash your hair, wash first with your normal shampoo.  After you use shampoo/soap, rinse your hair and body thoroughly to remove shampoo/soap residue.  Turn the water OFF and apply half the bottle of CHG soap to a CLEAN washcloth.  Apply CHG soap ONLY FROM YOUR NECK DOWN TO YOUR TOES (washing for 3-5 minutes)  DO NOT use CHG soap on  face, private areas, open wounds, or sores.  Pay special attention to the area where your surgery is being performed.  If you are having back surgery, having someone wash your back for you may be helpful. Wait 2 minutes after CHG soap is applied, then you may rinse off the CHG soap.  Pat dry with a clean towel  Put on clean pajamas    Additional instructions for the day of surgery: DO NOT APPLY any lotions, deodorants, cologne, or perfumes.   Do not wear jewelry or makeup Do not wear nail polish, gel polish, artificial nails, or any other type of covering on natural nails (fingers and toes) Do not bring valuables to the hospital. [facility] is not responsible for valuables/personal belongings. Put on clean/comfortable clothes.  Please brush your teeth.  Ask your nurse before applying any prescription medications to the skin.

## 2023-12-24 ENCOUNTER — Ambulatory Visit (HOSPITAL_COMMUNITY): Payer: BC Managed Care – PPO | Admitting: Anesthesiology

## 2023-12-24 ENCOUNTER — Encounter (HOSPITAL_COMMUNITY)
Admission: RE | Disposition: A | Discharge status: Home / Self Care | Payer: Self-pay | Source: Home / Self Care | Attending: Plastic Surgery

## 2023-12-24 ENCOUNTER — Encounter (HOSPITAL_COMMUNITY): Payer: Self-pay | Admitting: Plastic Surgery

## 2023-12-24 ENCOUNTER — Other Ambulatory Visit: Payer: Self-pay

## 2023-12-24 ENCOUNTER — Ambulatory Visit (HOSPITAL_COMMUNITY)
Admission: RE | Admit: 2023-12-24 | Discharge: 2023-12-24 | Disposition: A | Discharge status: Home / Self Care | Payer: BC Managed Care – PPO | Attending: Plastic Surgery | Admitting: Plastic Surgery

## 2023-12-24 DIAGNOSIS — N62 Hypertrophy of breast: Secondary | ICD-10-CM | POA: Insufficient documentation

## 2023-12-24 DIAGNOSIS — M542 Cervicalgia: Secondary | ICD-10-CM | POA: Insufficient documentation

## 2023-12-24 DIAGNOSIS — M546 Pain in thoracic spine: Secondary | ICD-10-CM | POA: Insufficient documentation

## 2023-12-24 DIAGNOSIS — M419 Scoliosis, unspecified: Secondary | ICD-10-CM | POA: Insufficient documentation

## 2023-12-24 HISTORY — PX: BREAST REDUCTION SURGERY: SHX8

## 2023-12-24 LAB — POCT PREGNANCY, URINE: Preg Test, Ur: NEGATIVE

## 2023-12-24 SURGERY — BREAST REDUCTION WITH LIPOSUCTION
Anesthesia: General | Site: Breast | Laterality: Bilateral

## 2023-12-24 MED ORDER — FENTANYL CITRATE (PF) 250 MCG/5ML IJ SOLN
INTRAMUSCULAR | Status: AC
  Filled 2023-12-24: qty 5

## 2023-12-24 MED ORDER — LIDOCAINE-EPINEPHRINE 1 %-1:100000 IJ SOLN
INTRAMUSCULAR | Status: AC
  Filled 2023-12-24: qty 1

## 2023-12-24 MED ORDER — DEXAMETHASONE SODIUM PHOSPHATE 10 MG/ML IJ SOLN
INTRAMUSCULAR | Status: DC | PRN
  Administered 2023-12-24: 10 mg via INTRAVENOUS

## 2023-12-24 MED ORDER — LIDOCAINE-EPINEPHRINE 1 %-1:100000 IJ SOLN
INTRAMUSCULAR | Status: DC | PRN
  Administered 2023-12-24: 30 mL via SURGICAL_CAVITY

## 2023-12-24 MED ORDER — OXYCODONE HCL 5 MG/5ML PO SOLN: 5.0000 mg | Freq: Once | ORAL | Status: DC | PRN

## 2023-12-24 MED ORDER — LIDOCAINE 2% (20 MG/ML) 5 ML SYRINGE
INTRAMUSCULAR | Status: DC | PRN
  Administered 2023-12-24: 80 mg via INTRAVENOUS

## 2023-12-24 MED ORDER — BUPIVACAINE LIPOSOME 1.3 % IJ SUSP
INTRAMUSCULAR | Status: AC
  Filled 2023-12-24: qty 20

## 2023-12-24 MED ORDER — DEXMEDETOMIDINE HCL IN NACL 80 MCG/20ML IV SOLN
INTRAVENOUS | Status: AC
  Filled 2023-12-24: qty 20

## 2023-12-24 MED ORDER — SODIUM CHLORIDE 0.9 % IV SOLN: 12.5000 mg | INTRAVENOUS | Status: DC | PRN

## 2023-12-24 MED ORDER — OXYCODONE HCL 5 MG PO TABS: 5.0000 mg | ORAL_TABLET | Freq: Once | ORAL | Status: DC | PRN

## 2023-12-24 MED ORDER — DEXMEDETOMIDINE HCL IN NACL 80 MCG/20ML IV SOLN
INTRAVENOUS | Status: DC | PRN
  Administered 2023-12-24 (×3): 4 ug via INTRAVENOUS
  Administered 2023-12-24: 8 ug via INTRAVENOUS

## 2023-12-24 MED ORDER — FENTANYL CITRATE (PF) 100 MCG/2ML IJ SOLN: 25.0000 ug | INTRAMUSCULAR | Status: DC | PRN

## 2023-12-24 MED ORDER — SCOPOLAMINE 1 MG/3DAYS TD PT72
MEDICATED_PATCH | TRANSDERMAL | Status: AC
  Filled 2023-12-24: qty 1

## 2023-12-24 MED ORDER — SUGAMMADEX SODIUM 200 MG/2ML IV SOLN
INTRAVENOUS | Status: AC
  Filled 2023-12-24: qty 2

## 2023-12-24 MED ORDER — HYDROMORPHONE HCL 1 MG/ML IJ SOLN: 0.2500 mg | INTRAMUSCULAR | Status: DC | PRN

## 2023-12-24 MED ORDER — 0.9 % SODIUM CHLORIDE (POUR BTL) OPTIME
TOPICAL | Status: DC | PRN
  Administered 2023-12-24: 1000 mL

## 2023-12-24 MED ORDER — PROPOFOL 10 MG/ML IV BOLUS
INTRAVENOUS | Status: AC
  Filled 2023-12-24: qty 20

## 2023-12-24 MED ORDER — SODIUM BICARBONATE 4.2 % IV SOLN
Freq: Once | INTRAVENOUS | Status: AC
  Administered 2023-12-24: 1000 mL via INTRAMUSCULAR
  Filled 2023-12-24: qty 50

## 2023-12-24 MED ORDER — ACETAMINOPHEN 10 MG/ML IV SOLN
INTRAVENOUS | Status: DC | PRN
  Administered 2023-12-24: 1000 mg via INTRAVENOUS

## 2023-12-24 MED ORDER — SODIUM CHLORIDE 0.9 % IV SOLN: 250.0000 mL | INTRAVENOUS | Status: DC | PRN

## 2023-12-24 MED ORDER — CHLORHEXIDINE GLUCONATE CLOTH 2 % EX PADS: 6.0000 | MEDICATED_PAD | Freq: Once | CUTANEOUS | Status: DC

## 2023-12-24 MED ORDER — ROCURONIUM BROMIDE 10 MG/ML (PF) SYRINGE
PREFILLED_SYRINGE | INTRAVENOUS | Status: DC | PRN
  Administered 2023-12-24: 20 mg via INTRAVENOUS
  Administered 2023-12-24: 80 mg via INTRAVENOUS

## 2023-12-24 MED ORDER — DEXAMETHASONE SODIUM PHOSPHATE 10 MG/ML IJ SOLN
INTRAMUSCULAR | Status: AC
  Filled 2023-12-24: qty 1

## 2023-12-24 MED ORDER — OXYCODONE HCL 5 MG PO TABS: 5.0000 mg | ORAL_TABLET | ORAL | Status: DC | PRN

## 2023-12-24 MED ORDER — PHENYLEPHRINE 80 MCG/ML (10ML) SYRINGE FOR IV PUSH (FOR BLOOD PRESSURE SUPPORT)
PREFILLED_SYRINGE | INTRAVENOUS | Status: DC | PRN
  Administered 2023-12-24: 40 ug via INTRAVENOUS
  Administered 2023-12-24 (×2): 80 ug via INTRAVENOUS
  Administered 2023-12-24: 40 ug via INTRAVENOUS

## 2023-12-24 MED ORDER — ROCURONIUM BROMIDE 10 MG/ML (PF) SYRINGE
PREFILLED_SYRINGE | INTRAVENOUS | Status: AC
  Filled 2023-12-24: qty 10

## 2023-12-24 MED ORDER — SODIUM CHLORIDE 0.9% FLUSH: 3.0000 mL | INTRAVENOUS | Status: DC | PRN

## 2023-12-24 MED ORDER — SCOPOLAMINE 1 MG/3DAYS TD PT72SCOPOLAMINE 1 MG/3DAYS
MEDICATED_PATCH | TRANSDERMAL | Status: DC | PRN
Start: 2023-12-24 — End: 2023-12-24
  Administered 2023-12-24: 1 via TRANSDERMAL

## 2023-12-24 MED ORDER — ONDANSETRON HCL 4 MG/2ML IJ SOLN
INTRAMUSCULAR | Status: AC
  Filled 2023-12-24: qty 2

## 2023-12-24 MED ORDER — SODIUM CHLORIDE 0.9 % IV SOLN
Freq: Once | INTRAVENOUS | Status: DC
  Filled 2023-12-24: qty 10

## 2023-12-24 MED ORDER — SODIUM CHLORIDE (PF) 0.9 % IJ SOLN
INTRAMUSCULAR | Status: AC
  Filled 2023-12-24: qty 20

## 2023-12-24 MED ORDER — ONDANSETRON HCL 4 MG/2ML IJ SOLN
INTRAMUSCULAR | Status: DC | PRN
  Administered 2023-12-24: 4 mg via INTRAVENOUS

## 2023-12-24 MED ORDER — CEFAZOLIN SODIUM-DEXTROSE 2-4 GM/100ML-% IV SOLN
INTRAVENOUS | Status: AC
  Filled 2023-12-24: qty 100

## 2023-12-24 MED ORDER — CHLORHEXIDINE GLUCONATE 0.12 % MT SOLN: 15.0000 mL | Freq: Once | OROMUCOSAL | Status: AC

## 2023-12-24 MED ORDER — MIDAZOLAM HCL 2 MG/2ML IJ SOLN
INTRAMUSCULAR | Status: AC
  Filled 2023-12-24: qty 2

## 2023-12-24 MED ORDER — MEPERIDINE HCL 25 MG/ML IJ SOLN: 6.2500 mg | INTRAMUSCULAR | Status: DC | PRN

## 2023-12-24 MED ORDER — CHLORHEXIDINE GLUCONATE 0.12 % MT SOLN
OROMUCOSAL | Status: AC
  Administered 2023-12-24: 15 mL via OROMUCOSAL
  Filled 2023-12-24: qty 15

## 2023-12-24 MED ORDER — SODIUM CHLORIDE 0.9% FLUSH: 3.0000 mL | Freq: Two times a day (BID) | INTRAVENOUS | Status: DC

## 2023-12-24 MED ORDER — LACTATED RINGERS IV SOLN: INTRAVENOUS | Status: DC | PRN

## 2023-12-24 MED ORDER — LIDOCAINE 2% (20 MG/ML) 5 ML SYRINGE
INTRAMUSCULAR | Status: AC
  Filled 2023-12-24: qty 5

## 2023-12-24 MED ORDER — SUGAMMADEX SODIUM 200 MG/2ML IV SOLN
INTRAVENOUS | Status: DC | PRN
  Administered 2023-12-24: 160 mg via INTRAVENOUS

## 2023-12-24 MED ORDER — AMISULPRIDE (ANTIEMETIC) 5 MG/2ML IV SOLN
10.0000 mg | Freq: Once | INTRAVENOUS | Status: DC | PRN
Start: 2023-12-24 — End: 2023-12-24

## 2023-12-24 MED ORDER — ACETAMINOPHEN 325 MG PO TABS: 650.0000 mg | ORAL_TABLET | ORAL | Status: DC | PRN

## 2023-12-24 MED ORDER — BUPIVACAINE LIPOSOME 1.3 % IJ SUSP
INTRAMUSCULAR | Status: DC | PRN
  Administered 2023-12-24: 20 mL

## 2023-12-24 MED ORDER — PROPOFOL 10 MG/ML IV BOLUS
INTRAVENOUS | Status: DC | PRN
  Administered 2023-12-24: 25 ug/kg/min via INTRAVENOUS
  Administered 2023-12-24: 160 mg via INTRAVENOUS

## 2023-12-24 MED ORDER — VASHE WOUND IRRIGATION OPTIME
TOPICAL | Status: DC | PRN
  Administered 2023-12-24: 34 [oz_av]

## 2023-12-24 MED ORDER — ORAL CARE MOUTH RINSE: 15.0000 mL | Freq: Once | OROMUCOSAL | Status: AC

## 2023-12-24 MED ORDER — FENTANYL CITRATE (PF) 250 MCG/5ML IJ SOLN
INTRAMUSCULAR | Status: DC | PRN
Start: 2023-12-24 — End: 2023-12-24
  Administered 2023-12-24 (×2): 50 ug via INTRAVENOUS
  Administered 2023-12-24: 100 ug via INTRAVENOUS
  Administered 2023-12-24: 50 ug via INTRAVENOUS

## 2023-12-24 MED ORDER — ACETAMINOPHEN 650 MG RE SUPP: 650.0000 mg | RECTAL | Status: DC | PRN

## 2023-12-24 MED ORDER — MIDAZOLAM HCL 2 MG/2ML IJ SOLN
INTRAMUSCULAR | Status: DC | PRN
  Administered 2023-12-24: 2 mg via INTRAVENOUS

## 2023-12-24 MED ORDER — CEFAZOLIN SODIUM-DEXTROSE 2-4 GM/100ML-% IV SOLN
2.0000 g | INTRAVENOUS | Status: AC
  Administered 2023-12-24: 2 g via INTRAVENOUS

## 2023-12-24 MED ORDER — BUPIVACAINE HCL (PF) 0.25 % IJ SOLN
INTRAMUSCULAR | Status: AC
  Filled 2023-12-24: qty 30

## 2023-12-24 SURGICAL SUPPLY — 54 items
BAG COUNTER SPONGE SURGICOUNT (BAG) ×2 IMPLANT
BAG DECANTER FOR FLEXI CONT (MISCELLANEOUS) IMPLANT
BINDER BREAST LRG (GAUZE/BANDAGES/DRESSINGS) IMPLANT
BINDER BREAST MEDIUM (GAUZE/BANDAGES/DRESSINGS) IMPLANT
BINDER BREAST XLRG (GAUZE/BANDAGES/DRESSINGS) IMPLANT
BINDER BREAST XXLRG (GAUZE/BANDAGES/DRESSINGS) IMPLANT
BIOPATCH RED 1 DISK 7.0 (GAUZE/BANDAGES/DRESSINGS) IMPLANT
BLADE SURG 10 STRL SS (BLADE) ×2 IMPLANT
BNDG GAUZE DERMACEA FLUFF 4 (GAUZE/BANDAGES/DRESSINGS) IMPLANT
CANISTER SUCT 3000ML PPV (MISCELLANEOUS) ×2 IMPLANT
CHLORAPREP W/TINT 26 (MISCELLANEOUS) ×2 IMPLANT
CLEANSER WND VASHE 34 (WOUND CARE) IMPLANT
COLLAGEN CELLERATERX 5 GRAM (Miscellaneous) IMPLANT
DERMABOND ADVANCED .7 DNX12 (GAUZE/BANDAGES/DRESSINGS) ×6 IMPLANT
DRAIN CHANNEL 19F RND (DRAIN) IMPLANT
DRAPE CHEST BREAST 15X10 FENES (DRAPES) ×2 IMPLANT
DRAPE SURG ORHT 6 SPLT 77X108 (DRAPES) ×4 IMPLANT
DRSG MEPILEX POST OP 4X8 (GAUZE/BANDAGES/DRESSINGS) ×4 IMPLANT
ELECT BLADE 4.0 EZ CLEAN MEGAD (MISCELLANEOUS) ×1
ELECT CAUTERY BLADE 6.4 (BLADE) ×2 IMPLANT
ELECT REM PT RETURN 9FT ADLT (ELECTROSURGICAL) ×1
ELECTRODE BLDE 4.0 EZ CLN MEGD (MISCELLANEOUS) ×4 IMPLANT
ELECTRODE REM PT RTRN 9FT ADLT (ELECTROSURGICAL) ×2 IMPLANT
EVACUATOR SILICONE 100CC (DRAIN) IMPLANT
GAUZE PAD ABD 8X10 STRL (GAUZE/BANDAGES/DRESSINGS) ×4 IMPLANT
GAUZE XEROFORM 5X9 LF (GAUZE/BANDAGES/DRESSINGS) IMPLANT
GLOVE BIO SURGEON STRL SZ 6.5 (GLOVE) ×4 IMPLANT
GLOVE SURG ENC TEXT LTX SZ7.5 (GLOVE) IMPLANT
GOWN STRL REUS W/ TWL LRG LVL3 (GOWN DISPOSABLE) ×4 IMPLANT
HEMOSTAT ARISTA ABSORB 3G PWDR (HEMOSTASIS) IMPLANT
MARKER SKIN DUAL TIP RULER LAB (MISCELLANEOUS) ×2 IMPLANT
NDL HYPO 25GX1X1/2 BEV (NEEDLE) ×2 IMPLANT
NEEDLE HYPO 25GX1X1/2 BEV (NEEDLE) ×1
NS IRRIG 1000ML POUR BTL (IV SOLUTION) ×2 IMPLANT
PACK GENERAL/GYN (CUSTOM PROCEDURE TRAY) ×2 IMPLANT
PAD FOAM SILICONE BACKED (GAUZE/BANDAGES/DRESSINGS) IMPLANT
SPIKE FLUID TRANSFER (MISCELLANEOUS) IMPLANT
SPONGE T-LAP 18X18 ~~LOC~~+RFID (SPONGE) ×4 IMPLANT
STAPLER VISISTAT 35W (STAPLE) IMPLANT
STRIP CLOSURE SKIN 1/2X4 (GAUZE/BANDAGES/DRESSINGS) ×4 IMPLANT
SUT MNCRL AB 3-0 PS2 27 (SUTURE) IMPLANT
SUT MON AB 3-0 SH27 (SUTURE) IMPLANT
SUT MON AB 4-0 PS1 27 (SUTURE) IMPLANT
SUT MON AB 5-0 PS2 18 (SUTURE) IMPLANT
SUT PDS AB 2-0 CT2 27 (SUTURE) IMPLANT
SUT PDS AB 3-0 SH 27 (SUTURE) IMPLANT
SUT SILK 3 0 PS 1 (SUTURE) IMPLANT
SUT SILK 4 0 PS 2 (SUTURE) IMPLANT
SUT VIC AB 3-0 SH 27X BRD (SUTURE) IMPLANT
SUT VICRYL 4-0 PS2 18IN ABS (SUTURE) IMPLANT
SYR 50ML LL SCALE MARK (SYRINGE) IMPLANT
SYR CONTROL 10ML LL (SYRINGE) ×2 IMPLANT
TOWEL GREEN STERILE FF (TOWEL DISPOSABLE) ×4 IMPLANT
TUBING INFILTRATION IT-10001 (TUBING) ×2 IMPLANT

## 2023-12-24 NOTE — Op Note (Signed)
Breast Reduction Op note:    DATE OF PROCEDURE: 12/24/2023  LOCATION: [facility] Main Operating Room  SURGEON: Foster Simpson, DO  ASSISTANT: Keenan Bachelor, PA  PREOPERATIVE DIAGNOSIS 1. Macromastia 2. Neck Pain 3. Back Pain  POSTOPERATIVE DIAGNOSIS 1. Macromastia 2. Neck Pain 3. Back Pain  PROCEDURES 1. Bilateral breast reduction.  Right reduction 505 g, Left reduction 585 g  COMPLICATIONS: None.  DRAINS: none  INDICATIONS FOR PROCEDURE Alice Vasquez is a 23 y.o. year-old female born on 2001-07-26,with a history of symptomatic macromastia with concominant back pain, neck pain, shoulder grooving from her bra.   MRN: [id number]  CONSENT Informed consent was obtained directly from the patient. The risks, benefits and alternatives were fully discussed. Specific risks including but not limited to bleeding, infection, hematoma, seroma, scarring, pain, nipple necrosis, asymmetry, poor cosmetic results, and need for further surgery were discussed. The patient's questions were answered.  DESCRIPTION OF PROCEDURE  Patient was brought into the operating room and rested on the operating room table in the supine position.  SCDs were placed and appropriate padding was performed.  Antibiotics were given. The patient underwent general anesthesia and the chest was prepped and draped in a sterile fashion.  A timeout was performed and all information was confirmed to be correct by those in the room. Tumescent was placed in the lateral breast on both sides. Liposuction was done laterally for better contour and symmetry.   Right side: Preoperative markings were confirmed.  Incision lines were injected with local containing epinephrine.  After waiting for vasoconstriction, the marked lines were incised with a #15 blade.  A Wise-pattern superomedial breast reduction was performed by de-epithelializing the pedicle, using bovie to create the superomedial pedicle, and removing breast tissue from  the superior, lateral, and inferior portions of the breast. Experel and cellerate were placed in the pocket.  Care was taken to not undermine the breast pedicle. Hemostasis was achieved.  The nipple was gently rotated into position and the soft tissue closed with 4-0 Monocryl.   The pocket was irrigated and hemostasis confirmed.  The deep tissues were approximated with 3-0 PDS sutures.  The skin was closed with deep dermal 3-0 Monocryl and subcuticular 4-0 Monocryl sutures.  The nipple and skin flaps had good capillary refill at the end of the procedure.    Left side: Preoperative markings were confirmed.  Incision lines were injected with local containing epinephrine.  After waiting for vasoconstriction, the marked lines were incised with a #15 blade.  A Wise-pattern superomedial breast reduction was performed by de-epithelializing the pedicle, using bovie to create the superomedial pedicle, and removing breast tissue from the superior, lateral, and inferior portions of the breast.  Care was taken to not undermine the breast pedicle. Hemostasis was achieved.  The nipple was gently rotated into position and the soft tissue was closed with 4-0 Monocryl.  The patient was sat upright and size and shape symmetry was confirmed.  Experel and cellerate were placed in the pocket. The pocket was irrigated and hemostasis confirmed.  The deep tissues were approximated with 3-0 PDS sutures. The skin was closed with deep dermal 3-0 Monocryl and subcuticular 4-0 Monocryl sutures.  Dermabond was applied.  A breast binder and ABDs were placed.  The nipple and skin flaps had good capillary refill at the end of the procedure.  The patient tolerated the procedure well. The patient was allowed to wake from anesthesia and taken to the recovery room in satisfactory condition.  The advanced practice  practitioner (APP) assisted throughout the case.  The APP was essential in retraction and counter traction when needed to make the case  progress smoothly.  This retraction and assistance made it possible to see the tissue plans for the procedure.  The assistance was needed for blood control, tissue re-approximation and assisted with closure of the incision site.

## 2023-12-24 NOTE — Discharge Instructions (Signed)

## 2023-12-24 NOTE — Anesthesia Procedure Notes (Signed)
Procedure Name: Intubation Date/Time: 12/24/2023 9:50 AM  Performed by: Ammie Dalton, CRNAPre-anesthesia Checklist: Patient identified, Emergency Drugs available, Suction available and Patient being monitored Patient Re-evaluated:Patient Re-evaluated prior to induction Oxygen Delivery Method: Circle System Utilized Preoxygenation: Pre-oxygenation with 100% oxygen Induction Type: IV induction Ventilation: Mask ventilation without difficulty Laryngoscope Size: Mac and 3 Grade View: Grade I Tube type: Oral Number of attempts: 1 Airway Equipment and Method: Stylet and Oral airway Placement Confirmation: ETT inserted through vocal cords under direct vision, positive ETCO2 and breath sounds checked- equal and bilateral Secured at: 21 cm Tube secured with: Tape Dental Injury: Teeth and Oropharynx as per pre-operative assessment

## 2023-12-24 NOTE — Interval H&P Note (Signed)
History and Physical Interval Note:  12/24/2023 9:37 AM  Alice Vasquez  has presented today for surgery, with the diagnosis of macromastia.  The various methods of treatment have been discussed with the patient and family. After consideration of risks, benefits and other options for treatment, the patient has consented to  Procedure(s): BREAST REDUCTION WITH LIPOSUCTION (Bilateral) as a surgical intervention.  The patient's history has been reviewed, patient examined, no change in status, stable for surgery.  I have reviewed the patient's chart and labs.  Questions were answered to the patient's satisfaction.     Alena Bills Alazia Crocket

## 2023-12-24 NOTE — Anesthesia Preprocedure Evaluation (Signed)
Anesthesia Evaluation  Patient identified by MRN, date of birth, ID band Patient awake    Reviewed: Allergy & Precautions, H&P , NPO status , Patient's Chart, lab work & pertinent test results  Airway Mallampati: II  TM Distance: >3 FB Neck ROM: Full    Dental no notable dental hx.    Pulmonary neg pulmonary ROS   Pulmonary exam normal breath sounds clear to auscultation       Cardiovascular negative cardio ROS Normal cardiovascular exam Rhythm:Regular Rate:Normal     Neuro/Psych   Anxiety     negative neurological ROS  negative psych ROS   GI/Hepatic negative GI ROS, Neg liver ROS,,,  Endo/Other  negative endocrine ROS    Renal/GU negative Renal ROS  negative genitourinary   Musculoskeletal negative musculoskeletal ROS (+)    Abdominal   Peds negative pediatric ROS (+)  Hematology negative hematology ROS (+)   Anesthesia Other Findings   Reproductive/Obstetrics negative OB ROS                             Anesthesia Physical Anesthesia Plan  ASA: 2  Anesthesia Plan: General   Post-op Pain Management:    Induction: Intravenous  PONV Risk Score and Plan: 3 and Ondansetron, Dexamethasone, Midazolam and Treatment may vary due to age or medical condition  Airway Management Planned: Oral ETT  Additional Equipment:   Intra-op Plan:   Post-operative Plan: Extubation in OR  Informed Consent: I have reviewed the patients History and Physical, chart, labs and discussed the procedure including the risks, benefits and alternatives for the proposed anesthesia with the patient or authorized representative who has indicated his/her understanding and acceptance.     Dental advisory given  Plan Discussed with: CRNA  Anesthesia Plan Comments:        Anesthesia Quick Evaluation

## 2023-12-24 NOTE — Anesthesia Postprocedure Evaluation (Signed)
Anesthesia Post Note  Patient: Alice Vasquez  Procedure(s) Performed: BREAST REDUCTION WITH LIPOSUCTION (Bilateral: Breast)     Patient location during evaluation: PACU Anesthesia Type: General Level of consciousness: awake and alert Pain management: pain level controlled Vital Signs Assessment: post-procedure vital signs reviewed and stable Respiratory status: spontaneous breathing, nonlabored ventilation and respiratory function stable Cardiovascular status: blood pressure returned to baseline and stable Postop Assessment: no apparent nausea or vomiting Anesthetic complications: no   No notable events documented.  Last Vitals:  Vitals:   12/24/23 1200 12/24/23 1215  BP: 119/80 112/75  Pulse: 97 86  Resp: 20 18  Temp:  36.7 C  SpO2: 95% 95%    Last Pain:  Vitals:   12/24/23 1200  TempSrc:   PainSc: 0-No pain                 Lowella Curb

## 2023-12-24 NOTE — Transfer of Care (Signed)
Immediate Anesthesia Transfer of Care Note  Patient: Alice Vasquez  Procedure(s) Performed: BREAST REDUCTION WITH LIPOSUCTION (Bilateral: Breast)  Patient Location: PACU  Anesthesia Type:General  Level of Consciousness: drowsy and patient cooperative  Airway & Oxygen Therapy: Patient Spontanous Breathing and Patient connected to nasal cannula oxygen  Post-op Assessment: Report given to RN and Post -op Vital signs reviewed and stable  Post vital signs: Reviewed and stable  Last Vitals:  Vitals Value Taken Time  BP 115/60 12/24/23 1140  Temp    Pulse 100 12/24/23 1144  Resp 20 12/24/23 1144  SpO2 99 % 12/24/23 1144  Vitals shown include unfiled device data.  Last Pain:  Vitals:   12/24/23 0838  TempSrc:   PainSc: 0-No pain         Complications: No notable events documented.

## 2023-12-25 ENCOUNTER — Encounter (HOSPITAL_COMMUNITY): Payer: Self-pay | Admitting: Plastic Surgery

## 2023-12-26 LAB — SURGICAL PATHOLOGY

## 2024-01-01 ENCOUNTER — Ambulatory Visit (INDEPENDENT_AMBULATORY_CARE_PROVIDER_SITE_OTHER): Payer: BC Managed Care – PPO | Admitting: Plastic Surgery

## 2024-01-01 ENCOUNTER — Encounter: Payer: Self-pay | Admitting: Plastic Surgery

## 2024-01-01 ENCOUNTER — Encounter: Payer: BC Managed Care – PPO | Admitting: Plastic Surgery

## 2024-01-01 VITALS — BP 142/91 | HR 85 | Ht 67.0 in | Wt 175.0 lb

## 2024-01-01 DIAGNOSIS — N62 Hypertrophy of breast: Secondary | ICD-10-CM

## 2024-01-01 NOTE — Progress Notes (Signed)
   Subjective:    Patient ID: Alice Vasquez, female    DOB: July 06, 2001, 23 y.o.   MRN: [id number]  The patient is a 23 year old female here for follow-up after undergoing bilateral breast reduction.  She had over 500 g removed from both breasts on 17 February.  There is no sign of hematoma or seroma.  Overall she is doing really well.  Bruising is as expected.      Review of Systems  Constitutional:  Positive for activity change.  Eyes: Negative.   Respiratory: Negative.    Cardiovascular: Negative.        Objective:   Physical Exam Constitutional:      Appearance: Normal appearance.  Cardiovascular:     Rate and Rhythm: Normal rate.     Pulses: Normal pulses.  Skin:    General: Skin is warm.     Capillary Refill: Capillary refill takes less than 2 seconds.  Neurological:     Mental Status: She is oriented to person, place, and time.  Psychiatric:        Mood and Affect: Mood normal.        Behavior: Behavior normal.        Thought Content: Thought content normal.        Judgment: Judgment normal.        Assessment & Plan:     ICD-10-CM   1. Symptomatic mammary hypertrophy  N62        Continue with sports bra as able and is fine to shower.  Follow-up in 1 to 2 weeks.    Pictures were obtained of the patient and placed in the chart with the patient's or guardian's permission.

## 2024-01-14 NOTE — Progress Notes (Deleted)
 Patient is a 23 y.o.-year-old female status post bilateral breast reduction with Dr.  Ulice Bold. Patient is 3 weeks postop.   Chaperone present on exam ***Bilateral NAC's are viable, bilateral breast incisions are intact.  There is no erythema or cellulitic changes noted. No obvious subcutaneous fluid collections noted with palpation.   A/P:  Recommend continuing with compressive garment 24/7 until 6 weeks post-op,  avoiding strenuous activity/heavy lifting until 6 weeks post-op  Recommend following up in ***  All of the patient's questions were answered to their content. Recommend calling with any questions or concerns.

## 2024-01-15 ENCOUNTER — Encounter: Payer: BC Managed Care – PPO | Admitting: Surgical

## 2024-01-15 ENCOUNTER — Ambulatory Visit: Admitting: Surgical

## 2024-01-15 VITALS — BP 112/76 | HR 81

## 2024-01-15 DIAGNOSIS — M546 Pain in thoracic spine: Secondary | ICD-10-CM

## 2024-01-15 DIAGNOSIS — M542 Cervicalgia: Secondary | ICD-10-CM

## 2024-01-15 DIAGNOSIS — G8929 Other chronic pain: Secondary | ICD-10-CM

## 2024-01-15 DIAGNOSIS — N62 Hypertrophy of breast: Secondary | ICD-10-CM

## 2024-01-15 NOTE — Progress Notes (Signed)
 Patient is a 23 y.o.-year-old female status post bilateral breast reduction with Dr.  Ulice Bold. Patient is 3 weeks postop.  Patient reports she is overall doing really well, she is happy with her results.  She reports significant improvement in her neck and back pain.  She reports she feels "lighter".  She does not have any specific concerns today.  Chaperone present on exam Bilateral NAC's are viable, bilateral breast incisions are intact. There is no erythema or cellulitic changes noted. No obvious subcutaneous fluid collections noted with palpation. Breasts are symmetric  A/P:  Recommend continuing with compressive garment 24/7 until 6 weeks post-op,  avoiding strenuous activity/heavy lifting until 6 weeks post-op  Sutures were removed.  Discussed Vaseline on the incisions can help with removing Dermabond.  Discussed she can begin using scar cream on incisions once Dermabond has come off.  Recommend following up in 3 weeks.  Pictures were obtained of the patient and placed in the chart with the patient's or guardian's permission.  All of the patient's questions were answered to their content. Recommend calling with any questions or concerns.

## 2024-01-28 ENCOUNTER — Encounter: Payer: Self-pay | Admitting: Surgical

## 2024-01-28 NOTE — Progress Notes (Unsigned)
 Patient is a 23 y.o.-year-old female status post bilateral breast reduction with Dr.  Ulice Bold. Patient is 5 weeks postop.  She reports she is doing really well, she is very happy with her results at this time, she does feel as if she has a few sutures that need to be removed.  She has also noticed a small indentation of the right lateral breast.  She has questions about when she can begin scar creams  Chaperone present on exam BP 122/71 (BP Location: Left Arm, Patient Position: Sitting, Cuff Size: Normal)   Pulse 60   Ht 5\' 7"  (1.702 m)   Wt 175 lb (79.4 kg)   SpO2 98%   BMI 27.41 kg/m  Bilateral NAC's are viable, bilateral breast incisions are intact. There is no erythema or cellulitic changes noted. No obvious subcutaneous fluid collections noted with palpation. She does have a small indentation of the right lateral breast just adjacent to the nipple, no overlying skin changes.  I do feel some firmness below this which is likely some mild fat necrosis.   A/P:  Patient is doing really well, there is no signs infection or concern on exam.  We discussed beginning scar cream today would be fine, recommend 1-2 times daily for a minimum of 3 months, up to 6 months.  We discussed massage to fat necrosis of bilateral breast, this will help soften up the breast.  They will continue to soften with time as well. There is no sign of infection or concern on exam.  Monocryl suture knots were removed and she tolerated this well.  Recommend continuing with compressive garment 24/7 until 6 weeks post-op,  avoiding strenuous activity/heavy lifting until 6 weeks post-op  Recommend following up as needed.  All of the patient's questions were answered to their content. Recommend calling with any questions or concerns.

## 2024-01-29 ENCOUNTER — Encounter: Payer: Self-pay | Admitting: Surgical

## 2024-01-29 ENCOUNTER — Ambulatory Visit (INDEPENDENT_AMBULATORY_CARE_PROVIDER_SITE_OTHER): Payer: BC Managed Care – PPO | Admitting: Surgical

## 2024-01-29 VITALS — BP 122/71 | HR 60 | Ht 67.0 in | Wt 175.0 lb

## 2024-01-29 DIAGNOSIS — N62 Hypertrophy of breast: Secondary | ICD-10-CM

## 2024-01-29 DIAGNOSIS — M546 Pain in thoracic spine: Secondary | ICD-10-CM

## 2024-01-29 DIAGNOSIS — G8929 Other chronic pain: Secondary | ICD-10-CM

## 2024-01-29 DIAGNOSIS — M542 Cervicalgia: Secondary | ICD-10-CM

## 2024-01-29 DIAGNOSIS — Z719 Counseling, unspecified: Secondary | ICD-10-CM
